# Patient Record
Sex: Male | Born: 1945 | Race: White | Hispanic: No | Marital: Married | Smoking: Never smoker
Health system: Southern US, Community
[De-identification: ages and names within clinical notes are randomized; demographics above are authoritative.]

## PROBLEM LIST (undated history)

## (undated) DIAGNOSIS — E079 Disorder of thyroid, unspecified: Secondary | ICD-10-CM

## (undated) DIAGNOSIS — E119 Type 2 diabetes mellitus without complications: Secondary | ICD-10-CM

## (undated) DIAGNOSIS — I10 Essential (primary) hypertension: Secondary | ICD-10-CM

## (undated) DIAGNOSIS — M1990 Unspecified osteoarthritis, unspecified site: Secondary | ICD-10-CM

## (undated) HISTORY — PX: HIP SURGERY: SHX245

## (undated) HISTORY — PX: KNEE SURGERY: SHX244

## (undated) HISTORY — PX: ROTATOR CUFF REPAIR: SHX139

---

## 2002-04-03 ENCOUNTER — Encounter: Payer: Self-pay | Admitting: Orthopedic Surgery

## 2002-04-03 ENCOUNTER — Ambulatory Visit (HOSPITAL_COMMUNITY): Admission: RE | Admit: 2002-04-03 | Discharge: 2002-04-03 | Payer: Self-pay | Admitting: Orthopedic Surgery

## 2014-01-11 ENCOUNTER — Emergency Department (HOSPITAL_COMMUNITY): Payer: No Typology Code available for payment source

## 2014-01-11 ENCOUNTER — Emergency Department (HOSPITAL_COMMUNITY)
Admission: EM | Admit: 2014-01-11 | Discharge: 2014-01-11 | Disposition: A | Discharge status: Home / Self Care | Payer: No Typology Code available for payment source | Attending: Emergency Medicine | Admitting: Emergency Medicine

## 2014-01-11 ENCOUNTER — Encounter (HOSPITAL_COMMUNITY): Payer: Self-pay | Admitting: Emergency Medicine

## 2014-01-11 DIAGNOSIS — S43409A Unspecified sprain of unspecified shoulder joint, initial encounter: Secondary | ICD-10-CM

## 2014-01-11 DIAGNOSIS — S6990XA Unspecified injury of unspecified wrist, hand and finger(s), initial encounter: Secondary | ICD-10-CM | POA: Insufficient documentation

## 2014-01-11 DIAGNOSIS — S139XXA Sprain of joints and ligaments of unspecified parts of neck, initial encounter: Secondary | ICD-10-CM | POA: Insufficient documentation

## 2014-01-11 DIAGNOSIS — S59909A Unspecified injury of unspecified elbow, initial encounter: Secondary | ICD-10-CM | POA: Insufficient documentation

## 2014-01-11 DIAGNOSIS — S59919A Unspecified injury of unspecified forearm, initial encounter: Secondary | ICD-10-CM

## 2014-01-11 DIAGNOSIS — M129 Arthropathy, unspecified: Secondary | ICD-10-CM | POA: Insufficient documentation

## 2014-01-11 DIAGNOSIS — Y92410 Unspecified street and highway as the place of occurrence of the external cause: Secondary | ICD-10-CM | POA: Insufficient documentation

## 2014-01-11 DIAGNOSIS — Z79899 Other long term (current) drug therapy: Secondary | ICD-10-CM | POA: Insufficient documentation

## 2014-01-11 DIAGNOSIS — Y9389 Activity, other specified: Secondary | ICD-10-CM | POA: Insufficient documentation

## 2014-01-11 DIAGNOSIS — S0990XA Unspecified injury of head, initial encounter: Secondary | ICD-10-CM | POA: Insufficient documentation

## 2014-01-11 DIAGNOSIS — S161XXA Strain of muscle, fascia and tendon at neck level, initial encounter: Secondary | ICD-10-CM

## 2014-01-11 DIAGNOSIS — Z7982 Long term (current) use of aspirin: Secondary | ICD-10-CM | POA: Insufficient documentation

## 2014-01-11 DIAGNOSIS — Z791 Long term (current) use of non-steroidal anti-inflammatories (NSAID): Secondary | ICD-10-CM | POA: Insufficient documentation

## 2014-01-11 DIAGNOSIS — I10 Essential (primary) hypertension: Secondary | ICD-10-CM | POA: Insufficient documentation

## 2014-01-11 DIAGNOSIS — E079 Disorder of thyroid, unspecified: Secondary | ICD-10-CM | POA: Insufficient documentation

## 2014-01-11 DIAGNOSIS — IMO0002 Reserved for concepts with insufficient information to code with codable children: Secondary | ICD-10-CM | POA: Insufficient documentation

## 2014-01-11 DIAGNOSIS — Z794 Long term (current) use of insulin: Secondary | ICD-10-CM | POA: Insufficient documentation

## 2014-01-11 DIAGNOSIS — E119 Type 2 diabetes mellitus without complications: Secondary | ICD-10-CM | POA: Insufficient documentation

## 2014-01-11 HISTORY — DX: Type 2 diabetes mellitus without complications: E11.9

## 2014-01-11 HISTORY — DX: Unspecified osteoarthritis, unspecified site: M19.90

## 2014-01-11 HISTORY — DX: Disorder of thyroid, unspecified: E07.9

## 2014-01-11 HISTORY — DX: Essential (primary) hypertension: I10

## 2014-01-11 MED ORDER — CYCLOBENZAPRINE HCL 5 MG PO TABS: 5.0000 mg | ORAL_TABLET | Freq: Three times a day (TID) | ORAL | Status: AC | PRN

## 2014-01-11 MED ORDER — NAPROXEN 500 MG PO TABS: 500.0000 mg | ORAL_TABLET | Freq: Two times a day (BID) | ORAL | Status: AC
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
## 2014-01-11 NOTE — ED Notes (Signed)
Patient transported to X-ray before coming to room

## 2014-01-11 NOTE — ED Notes (Signed)
Complaints of soreness in his shoulders. Denies dizziness, or other pain at present. State that he does have a slight headache.

## 2014-01-11 NOTE — ED Notes (Signed)
Patient transported from X-ray 

## 2014-01-11 NOTE — ED Notes (Signed)
Bed: WA20 Expected date:  Expected time:  Means of arrival:  Comments: Triage 8 

## 2014-01-11 NOTE — ED Notes (Signed)
Bed: WTR8 Expected date:  Expected time:  Means of arrival:  Comments: Pt in room.  

## 2014-01-11 NOTE — ED Notes (Signed)
Spouse states that patient went to the restroom
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
